# Patient Record
Sex: Male | Born: 1980 | Race: Black or African American | Hispanic: No | Marital: Single | Smoking: Never smoker
Health system: Southern US, Community
[De-identification: ages and names within clinical notes are randomized; demographics above are authoritative.]

## PROBLEM LIST (undated history)

## (undated) DIAGNOSIS — Z789 Other specified health status: Secondary | ICD-10-CM

## (undated) DIAGNOSIS — F419 Anxiety disorder, unspecified: Secondary | ICD-10-CM

## (undated) DIAGNOSIS — F32A Depression, unspecified: Secondary | ICD-10-CM

## (undated) HISTORY — PX: NO PAST SURGERIES: SHX2092

---
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
## 2004-07-14 ENCOUNTER — Emergency Department (HOSPITAL_COMMUNITY): Admission: EM | Admit: 2004-07-14 | Discharge: 2004-07-14 | Payer: Self-pay | Admitting: *Deleted

## 2006-02-07 IMAGING — CR DG FINGER THUMB 2+V*R*
1 series · 1 of 1 positions shown · non-contrast
Comparison: none

CLINICAL DATA: Right thumb injury, pain and swelling.  
 RIGHT THUMB - 3 VIEW:
 Soft tissue swelling overlying the MCP joint noted without evidence of fracture, subluxation, dislocation.

[view not recorded]
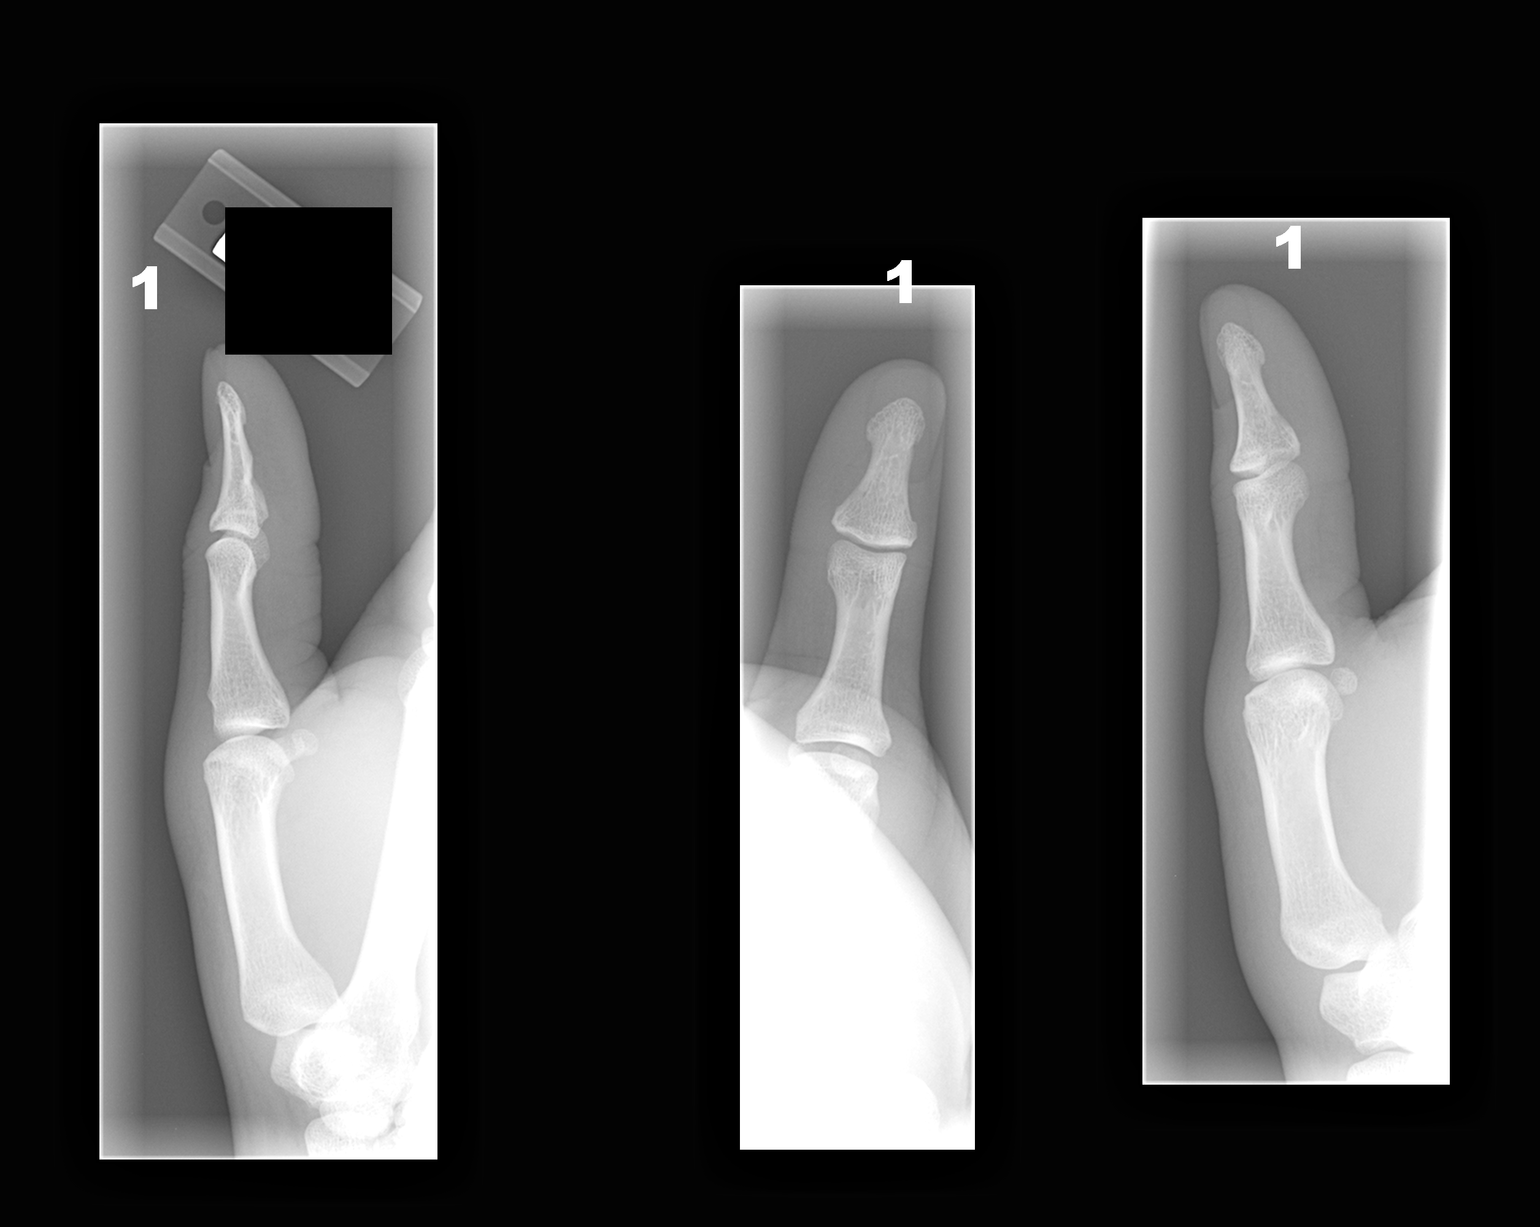

[1 of 1 positions shown; findings below may reference images not displayed]

IMPRESSION: Soft tissue swelling without acute bony abnormality.

## 2010-09-04 ENCOUNTER — Emergency Department (HOSPITAL_COMMUNITY)
Admission: EM | Admit: 2010-09-04 | Discharge: 2010-09-04 | Disposition: A | Discharge status: Home / Self Care | Payer: Self-pay | Attending: Emergency Medicine | Admitting: Emergency Medicine

## 2010-09-04 ENCOUNTER — Emergency Department (HOSPITAL_COMMUNITY): Payer: Self-pay

## 2010-09-04 DIAGNOSIS — S93409A Sprain of unspecified ligament of unspecified ankle, initial encounter: Secondary | ICD-10-CM | POA: Insufficient documentation

## 2010-09-04 DIAGNOSIS — W1789XA Other fall from one level to another, initial encounter: Secondary | ICD-10-CM | POA: Insufficient documentation

## 2010-10-18 ENCOUNTER — Emergency Department (HOSPITAL_COMMUNITY)
Admission: EM | Admit: 2010-10-18 | Discharge: 2010-10-18 | Discharge status: Against Medical Advice | Payer: Self-pay | Attending: Emergency Medicine | Admitting: Emergency Medicine

## 2010-10-18 DIAGNOSIS — Z0389 Encounter for observation for other suspected diseases and conditions ruled out: Secondary | ICD-10-CM | POA: Insufficient documentation

## 2018-10-25 ENCOUNTER — Encounter (HOSPITAL_COMMUNITY): Payer: Self-pay | Admitting: Emergency Medicine

## 2018-10-25 ENCOUNTER — Emergency Department (HOSPITAL_COMMUNITY)
Admission: EM | Admit: 2018-10-25 | Discharge: 2018-10-25 | Disposition: A | Discharge status: Home / Self Care | Payer: Self-pay | Attending: Emergency Medicine | Admitting: Emergency Medicine

## 2018-10-25 ENCOUNTER — Emergency Department (HOSPITAL_COMMUNITY): Payer: Self-pay

## 2018-10-25 ENCOUNTER — Other Ambulatory Visit: Payer: Self-pay

## 2018-10-25 DIAGNOSIS — Y929 Unspecified place or not applicable: Secondary | ICD-10-CM | POA: Insufficient documentation

## 2018-10-25 DIAGNOSIS — S82402A Unspecified fracture of shaft of left fibula, initial encounter for closed fracture: Secondary | ICD-10-CM | POA: Insufficient documentation

## 2018-10-25 DIAGNOSIS — Y9301 Activity, walking, marching and hiking: Secondary | ICD-10-CM | POA: Insufficient documentation

## 2018-10-25 DIAGNOSIS — W109XXA Fall (on) (from) unspecified stairs and steps, initial encounter: Secondary | ICD-10-CM | POA: Insufficient documentation

## 2018-10-25 DIAGNOSIS — Y999 Unspecified external cause status: Secondary | ICD-10-CM | POA: Insufficient documentation

## 2018-10-25 HISTORY — DX: Other specified health status: Z78.9

## 2018-10-25 MED ORDER — OXYCODONE-ACETAMINOPHEN 5-325 MG PO TABS
2.0000 | ORAL_TABLET | Freq: Once | ORAL | Status: AC
  Administered 2018-10-25: 2 via ORAL
  Filled 2018-10-25: qty 2

## 2018-10-25 MED ORDER — OXYCODONE-ACETAMINOPHEN 5-325 MG PO TABS: 1.0000 | ORAL_TABLET | ORAL | 0 refills | Status: DC | PRN

## 2018-10-25 MED ORDER — ONDANSETRON 4 MG PO TBDP
4.0000 mg | ORAL_TABLET | Freq: Once | ORAL | Status: AC
  Administered 2018-10-25: 04:00:00 4 mg via ORAL
  Filled 2018-10-25: qty 1

## 2018-10-25 NOTE — Progress Notes (Signed)
Orthopedic Tech Progress Note Patient Details:  SOTA HETZ 1980/10/03 [id number]  Ortho Devices Type of Ortho Device: Post (short leg) splint, Stirrup splint, Crutches Ortho Device/Splint Location: rle Ortho Device/Splint Interventions: Ordered, Application, Adjustment   Post Interventions Patient Tolerated: Well Instructions Provided: Care of device, Adjustment of device   Karolee Stamps 10/25/2018, 5:33 AM

## 2018-10-25 NOTE — ED Triage Notes (Signed)
BIB [facility] from home. Pt had mechanical fall on stairs in new home. Per EMS some ankle deformity. Sensation intact, able to move toes, and pulses palpable. Some ETOH onboard.

## 2018-10-25 NOTE — ED Notes (Signed)
Patient verbalizes understanding of discharge instructions. Opportunity for questioning and answers were provided. Armband removed by staff, pt discharged from ED ambulatory on crutches.

## 2018-10-25 NOTE — ED Provider Notes (Signed)
TIME SEEN: 2:49 AM  CHIEF COMPLAINT: Left ankle injury  HPI: Patient is a 38 year old male with no significant past medical history who presents to the emergency department with a left ankle injury.  States he slipped going down stairs and his ankle went behind him and underneath him and he felt a "pop".  No head injury or loss of consciousness.  No neck or back pain.  No chest or abdominal pain.  Unable to ambulate after the fall.  ROS: See HPI Constitutional: no fever  Eyes: no drainage  ENT: no runny nose   Cardiovascular:  no chest pain  Resp: no SOB  GI: no vomiting GU: no dysuria Integumentary: no rash  Allergy: no hives  Musculoskeletal: no leg swelling  Neurological: no slurred speech ROS otherwise negative  PAST MEDICAL HISTORY/PAST SURGICAL HISTORY:  Past Medical History:  Diagnosis Date  . No pertinent past medical history     MEDICATIONS:  Prior to Admission medications   Not on File    ALLERGIES:  No Known Allergies  SOCIAL HISTORY:  Social History   Tobacco Use  . Smoking status: Never Smoker  . Smokeless tobacco: Never Used  Substance Use Topics  . Alcohol use: Yes    Alcohol/week: 2.0 standard drinks    Types: 2 Cans of beer per week    Comment: Daily    FAMILY HISTORY: No family history on file.  EXAM: BP (!) 141/97 (BP Location: Right Arm)   Pulse (!) 101   Temp 99 F (37.2 C) (Oral)   Resp (!) 24   Ht 5\' 8"  (1.727 m)   Wt 106.6 kg   SpO2 100%   BMI 35.73 kg/m  CONSTITUTIONAL: Alert and oriented and responds appropriately to questions. Well-appearing; well-nourished; GCS 15, obese, in no distress, laughing HEAD: Normocephalic; atraumatic EYES: Conjunctivae clear, PERRL, EOMI ENT: normal nose; no rhinorrhea; moist mucous membranes; pharynx without lesions noted; no dental injury; no septal hematoma NECK: Supple, no meningismus, no LAD; no midline spinal tenderness, step-off or deformity; trachea midline CARD: Regular and minimally  tachycardic; S1 and S2 appreciated; no murmurs, no clicks, no rubs, no gallops RESP: Normal chest excursion without splinting or tachypnea; breath sounds clear and equal bilaterally; no wheezes, no rhonchi, no rales; no hypoxia or respiratory distress CHEST:  chest wall stable, no crepitus or ecchymosis or deformity, nontender to palpation; no flail chest ABD/GI: Normal bowel sounds; non-distended; soft, non-tender, no rebound, no guarding; no ecchymosis or other lesions noted PELVIS:  stable, nontender to palpation BACK:  The back appears normal and is non-tender to palpation, there is no CVA tenderness; no midline spinal tenderness, step-off or deformity EXT: Patient has swelling, bruising and tenderness over the left medial malleolus.  He has a small punctate wound here that he states was from a previous insect bite.  No tenderness over the left foot, proximal tibia or fibula including the left fibular head, knee, femur or hip.  His compartments in the left leg are soft.  He is a 2+ left DP pulse and can move his toes without difficulty.  Reports normal sensation throughout the left leg.  Otherwise his extremities have no deformity noted. SKIN: Normal color for age and race; warm NEURO: Moves all extremities equally PSYCH: The patient's mood and manner are appropriate. Grooming and personal hygiene are appropriate.  MEDICAL DECISION MAKING: Patient here with left ankle injury.  Will obtain x-rays.  No other injury on exam.  He has neurovascularly intact distally.  Have  offered him pain medication several times which he declines.  ED PROGRESS: X-ray showed displaced distal fibular fracture and widening of the medial clear space consistent with ligamentous injury.  Will place in a posterior splint with stirrups.  We will keep patient nonweightbearing and have him follow-up with orthopedics as an outpatient.  Will discharge with pain medication.   At this time, I do not feel there is any  life-threatening condition present. I have reviewed and discussed all results (EKG, imaging, lab, urine as appropriate) and exam findings with patient/family. I have reviewed nursing notes and appropriate previous records.  I feel the patient is safe to be discharged home without further emergent workup and can continue workup as an outpatient as needed. Discussed usual and customary return precautions. Patient/family verbalize understanding and are comfortable with this plan.  Outpatient follow-up has been provided as needed. All questions have been answered.    SPLINT APPLICATION Date/Time: 3:32 AM Authorized by: Baxter HireKristen Hermes Wafer Consent: Verbal consent obtained. Risks and benefits: risks, benefits and alternatives were discussed Consent given by: patient Splint applied by: orthopedic technician Location details: left ankle Splint type: posterior splint with stirrups Supplies used: fiberglass Post-procedure: The splinted body part was neurovascularly unchanged following the procedure. Patient tolerance: Patient tolerated the procedure well with no immediate complications.        Edilson Vital, Layla MawKristen N, DO 10/25/18 [phone]

## 2018-10-27 ENCOUNTER — Encounter (HOSPITAL_BASED_OUTPATIENT_CLINIC_OR_DEPARTMENT_OTHER): Payer: Self-pay | Admitting: *Deleted

## 2018-10-27 ENCOUNTER — Other Ambulatory Visit: Payer: Self-pay

## 2018-10-27 ENCOUNTER — Other Ambulatory Visit (HOSPITAL_COMMUNITY)
Admission: RE | Admit: 2018-10-27 | Discharge: 2018-10-27 | Disposition: A | Discharge status: Home / Self Care | Payer: HRSA Program | Source: Ambulatory Visit | Attending: Orthopedic Surgery | Admitting: Orthopedic Surgery

## 2018-10-27 DIAGNOSIS — Z1159 Encounter for screening for other viral diseases: Secondary | ICD-10-CM | POA: Diagnosis present

## 2018-10-27 LAB — SARS CORONAVIRUS 2 (TAT 6-24 HRS): SARS Coronavirus 2: POSITIVE — AB

## 2018-10-27 NOTE — Anesthesia Preprocedure Evaluation (Addendum)
Anesthesia Evaluation  Patient identified by MRN, date of birth, ID band Patient awake    Reviewed: Allergy & Precautions, H&P , NPO status , Patient's Chart, lab work & pertinent test results  Airway Mallampati: II  TM Distance: >3 FB Neck ROM: Full    Dental no notable dental hx. (+) Teeth Intact, Dental Advisory Given   Pulmonary neg pulmonary ROS,    Pulmonary exam normal breath sounds clear to auscultation       Cardiovascular negative cardio ROS   Rhythm:Regular Rate:Normal     Neuro/Psych negative neurological ROS  negative psych ROS   GI/Hepatic negative GI ROS, Neg liver ROS,   Endo/Other  Morbid obesityObesity  Renal/GU negative Renal ROS  negative genitourinary   Musculoskeletal Fx/dislocation right fibular Fx   Abdominal   Peds  Hematology negative hematology ROS (+)   Anesthesia Other Findings   Reproductive/Obstetrics negative OB ROS                            Anesthesia Physical Anesthesia Plan  ASA: II  Anesthesia Plan: MAC and Regional   Post-op Pain Management:    Induction: Intravenous  PONV Risk Score and Plan: 2 and Ondansetron, Treatment may vary due to age or medical condition, Propofol infusion and Dexamethasone  Airway Management Planned: Simple Face Mask  Additional Equipment:   Intra-op Plan:   Post-operative Plan:   Informed Consent: I have reviewed the patients History and Physical, chart, labs and discussed the procedure including the risks, benefits and alternatives for the proposed anesthesia with the patient or authorized representative who has indicated his/her understanding and acceptance.     Dental advisory given  Plan Discussed with: CRNA  Anesthesia Plan Comments: (Covid-19 test is positive.)      Anesthesia Quick Evaluation

## 2018-10-28 ENCOUNTER — Ambulatory Visit (HOSPITAL_COMMUNITY): Payer: Self-pay | Admitting: Anesthesiology

## 2018-10-28 ENCOUNTER — Encounter (HOSPITAL_COMMUNITY)
Admission: RE | Disposition: A | Discharge status: Home / Self Care | Payer: Self-pay | Source: Ambulatory Visit | Attending: Orthopedic Surgery

## 2018-10-28 ENCOUNTER — Encounter (HOSPITAL_COMMUNITY): Payer: Self-pay | Admitting: *Deleted

## 2018-10-28 ENCOUNTER — Ambulatory Visit (HOSPITAL_COMMUNITY)
Admission: RE | Admit: 2018-10-28 | Discharge: 2018-10-28 | Disposition: A | Discharge status: Home / Self Care | Payer: Self-pay | Source: Ambulatory Visit | Attending: Orthopedic Surgery | Admitting: Orthopedic Surgery

## 2018-10-28 ENCOUNTER — Other Ambulatory Visit: Payer: Self-pay

## 2018-10-28 DIAGNOSIS — W19XXXA Unspecified fall, initial encounter: Secondary | ICD-10-CM | POA: Insufficient documentation

## 2018-10-28 DIAGNOSIS — S93431A Sprain of tibiofibular ligament of right ankle, initial encounter: Secondary | ICD-10-CM | POA: Insufficient documentation

## 2018-10-28 DIAGNOSIS — S82831A Other fracture of upper and lower end of right fibula, initial encounter for closed fracture: Secondary | ICD-10-CM | POA: Insufficient documentation

## 2018-10-28 DIAGNOSIS — S82891A Other fracture of right lower leg, initial encounter for closed fracture: Secondary | ICD-10-CM

## 2018-10-28 HISTORY — PX: ORIF ANKLE FRACTURE: SHX5408

## 2018-10-28 SURGERY — OPEN REDUCTION INTERNAL FIXATION (ORIF) ANKLE FRACTURE
Anesthesia: Monitor Anesthesia Care | Laterality: Right

## 2018-10-28 MED ORDER — ONDANSETRON HCL 4 MG/2ML IJ SOLN
INTRAMUSCULAR | Status: AC
  Filled 2018-10-28: qty 2

## 2018-10-28 MED ORDER — METHOCARBAMOL 500 MG PO TABS: 500.0000 mg | ORAL_TABLET | Freq: Two times a day (BID) | ORAL | 0 refills | Status: AC | PRN

## 2018-10-28 MED ORDER — DEXAMETHASONE SODIUM PHOSPHATE 10 MG/ML IJ SOLN
INTRAMUSCULAR | Status: DC | PRN
  Administered 2018-10-28: 4 mg via INTRAVENOUS

## 2018-10-28 MED ORDER — CELECOXIB 200 MG PO CAPS
ORAL_CAPSULE | ORAL | Status: AC
  Administered 2018-10-28: 09:00:00 400 mg via ORAL
  Filled 2018-10-28: qty 1

## 2018-10-28 MED ORDER — LACTATED RINGERS IV SOLN
INTRAVENOUS | Status: DC
  Administered 2018-10-28: 10:00:00 via INTRAVENOUS

## 2018-10-28 MED ORDER — BUPIVACAINE HCL (PF) 0.25 % IJ SOLN
INTRAMUSCULAR | Status: AC
  Filled 2018-10-28: qty 30

## 2018-10-28 MED ORDER — BUPIVACAINE HCL (PF) 0.5 % IJ SOLN
INTRAMUSCULAR | Status: DC | PRN
  Administered 2018-10-28: 10 mL

## 2018-10-28 MED ORDER — CELECOXIB 100 MG PO CAPS
400.0000 mg | ORAL_CAPSULE | Freq: Once | ORAL | Status: AC
  Administered 2018-10-28: 09:00:00 400 mg via ORAL
  Filled 2018-10-28: qty 4

## 2018-10-28 MED ORDER — MIDAZOLAM HCL 5 MG/5ML IJ SOLN
INTRAMUSCULAR | Status: DC | PRN
  Administered 2018-10-28: 2 mg via INTRAVENOUS

## 2018-10-28 MED ORDER — PROPOFOL 10 MG/ML IV BOLUS
INTRAVENOUS | Status: AC
  Filled 2018-10-28: qty 20

## 2018-10-28 MED ORDER — ACETAMINOPHEN 500 MG PO TABS
1000.0000 mg | ORAL_TABLET | Freq: Once | ORAL | Status: AC
  Administered 2018-10-28: 09:00:00 1000 mg via ORAL

## 2018-10-28 MED ORDER — BUPIVACAINE-EPINEPHRINE (PF) 0.5% -1:200000 IJ SOLN
INTRAMUSCULAR | Status: DC | PRN
  Administered 2018-10-28: 30 mL via PERINEURAL

## 2018-10-28 MED ORDER — FENTANYL CITRATE (PF) 100 MCG/2ML IJ SOLN
INTRAMUSCULAR | Status: DC | PRN
  Administered 2018-10-28 (×2): 50 ug via INTRAVENOUS

## 2018-10-28 MED ORDER — GABAPENTIN 300 MG PO CAPS
ORAL_CAPSULE | ORAL | Status: AC
  Administered 2018-10-28: 09:00:00 300 mg via ORAL
  Filled 2018-10-28: qty 1

## 2018-10-28 MED ORDER — GABAPENTIN 300 MG PO CAPS: 300.0000 mg | ORAL_CAPSULE | Freq: Two times a day (BID) | ORAL | 0 refills | Status: AC | PRN

## 2018-10-28 MED ORDER — CHLORHEXIDINE GLUCONATE 4 % EX LIQD: 60.0000 mL | Freq: Once | CUTANEOUS | Status: DC

## 2018-10-28 MED ORDER — CELECOXIB 200 MG PO CAPS: 200.0000 mg | ORAL_CAPSULE | Freq: Two times a day (BID) | ORAL | 0 refills | Status: AC

## 2018-10-28 MED ORDER — CELECOXIB 200 MG PO CAPS
ORAL_CAPSULE | ORAL | Status: AC
  Filled 2018-10-28: qty 1

## 2018-10-28 MED ORDER — CEFAZOLIN SODIUM-DEXTROSE 2-4 GM/100ML-% IV SOLN
INTRAVENOUS | Status: AC
  Filled 2018-10-28: qty 100

## 2018-10-28 MED ORDER — PROPOFOL 500 MG/50ML IV EMUL
INTRAVENOUS | Status: DC | PRN
  Administered 2018-10-28: 30 ug/kg/min via INTRAVENOUS

## 2018-10-28 MED ORDER — 0.9 % SODIUM CHLORIDE (POUR BTL) OPTIME
TOPICAL | Status: DC | PRN
  Administered 2018-10-28: 1000 mL

## 2018-10-28 MED ORDER — PHENYLEPHRINE 40 MCG/ML (10ML) SYRINGE FOR IV PUSH (FOR BLOOD PRESSURE SUPPORT)
PREFILLED_SYRINGE | INTRAVENOUS | Status: AC
  Filled 2018-10-28: qty 10

## 2018-10-28 MED ORDER — MIDAZOLAM HCL 2 MG/2ML IJ SOLN
INTRAMUSCULAR | Status: AC
  Filled 2018-10-28: qty 2

## 2018-10-28 MED ORDER — FENTANYL CITRATE (PF) 250 MCG/5ML IJ SOLN
INTRAMUSCULAR | Status: AC
  Filled 2018-10-28: qty 5

## 2018-10-28 MED ORDER — GABAPENTIN 100 MG PO CAPS
300.0000 mg | ORAL_CAPSULE | Freq: Once | ORAL | Status: AC
  Administered 2018-10-28: 09:00:00 300 mg via ORAL

## 2018-10-28 MED ORDER — ACETAMINOPHEN 500 MG PO TABS
ORAL_TABLET | ORAL | Status: AC
  Administered 2018-10-28: 1000 mg via ORAL
  Filled 2018-10-28: qty 2

## 2018-10-28 MED ORDER — ONDANSETRON HCL 4 MG PO TABS: 4.0000 mg | ORAL_TABLET | Freq: Three times a day (TID) | ORAL | 0 refills | Status: AC | PRN

## 2018-10-28 MED ORDER — ONDANSETRON HCL 4 MG/2ML IJ SOLN
INTRAMUSCULAR | Status: DC | PRN
  Administered 2018-10-28: 4 mg via INTRAVENOUS

## 2018-10-28 MED ORDER — OXYCODONE HCL 5 MG PO TABS: 5.0000 mg | ORAL_TABLET | ORAL | 0 refills | Status: AC | PRN

## 2018-10-28 MED ORDER — CEFAZOLIN SODIUM-DEXTROSE 2-4 GM/100ML-% IV SOLN
2.0000 g | INTRAVENOUS | Status: AC
  Administered 2018-10-28: 2 g via INTRAVENOUS

## 2018-10-28 MED ORDER — ACETAMINOPHEN 500 MG PO TABS: 1000.0000 mg | ORAL_TABLET | Freq: Three times a day (TID) | ORAL | 0 refills | Status: AC

## 2018-10-28 MED ORDER — ASPIRIN EC 81 MG PO TBEC: 81.0000 mg | DELAYED_RELEASE_TABLET | Freq: Two times a day (BID) | ORAL | 0 refills | Status: AC

## 2018-10-28 SURGICAL SUPPLY — 68 items
BANDAGE ACE 4X5 VEL STRL LF (GAUZE/BANDAGES/DRESSINGS) ×2 IMPLANT
BANDAGE ELASTIC 4 VELCRO ST LF (GAUZE/BANDAGES/DRESSINGS) ×2 IMPLANT
BANDAGE ESMARK 6X9 LF (GAUZE/BANDAGES/DRESSINGS) ×1 IMPLANT
BIT DRILL 2.5 CANN STRL (BIT) ×2 IMPLANT
BIT DRILL 3.5 CANN STRL (BIT) ×2 IMPLANT
BNDG ESMARK 6X9 LF (GAUZE/BANDAGES/DRESSINGS) ×3
CANISTER SUCT 3000ML PPV (MISCELLANEOUS) ×3 IMPLANT
CLOSURE WOUND 1/2 X4 (GAUZE/BANDAGES/DRESSINGS)
COVER SURGICAL LIGHT HANDLE (MISCELLANEOUS) ×3 IMPLANT
COVER WAND RF STERILE (DRAPES) ×1 IMPLANT
CUFF TOURNIQUET SINGLE 34IN LL (TOURNIQUET CUFF) IMPLANT
DRAPE C-ARM 42X72 X-RAY (DRAPES) IMPLANT
DRAPE C-ARMOR (DRAPES) IMPLANT
DRAPE OEC MINIVIEW 54X84 (DRAPES) ×3 IMPLANT
DRAPE ORTHO SPLIT 77X108 STRL (DRAPES)
DRAPE SURG ORHT 6 SPLT 77X108 (DRAPES) IMPLANT
DRAPE U-SHAPE 47X51 STRL (DRAPES) ×2 IMPLANT
DRSG ADAPTIC 3X8 NADH LF (GAUZE/BANDAGES/DRESSINGS) ×2 IMPLANT
DRSG PAD ABDOMINAL 8X10 ST (GAUZE/BANDAGES/DRESSINGS) ×2 IMPLANT
DURAPREP 26ML APPLICATOR (WOUND CARE) ×3 IMPLANT
ELECT REM PT RETURN 9FT ADLT (ELECTROSURGICAL) ×3
ELECTRODE REM PT RTRN 9FT ADLT (ELECTROSURGICAL) ×1 IMPLANT
GAUZE SPONGE 4X4 12PLY STRL (GAUZE/BANDAGES/DRESSINGS) ×1 IMPLANT
GAUZE SPONGE 4X4 12PLY STRL LF (GAUZE/BANDAGES/DRESSINGS) ×2 IMPLANT
GLOVE BIO SURGEON STRL SZ7.5 (GLOVE) ×6 IMPLANT
GLOVE BIO SURGEON STRL SZ8 (GLOVE) ×3 IMPLANT
GLOVE BIOGEL PI IND STRL 8 (GLOVE) ×2 IMPLANT
GLOVE BIOGEL PI INDICATOR 8 (GLOVE) ×4
GOWN STRL REUS W/ TWL LRG LVL3 (GOWN DISPOSABLE) ×2 IMPLANT
GOWN STRL REUS W/ TWL XL LVL3 (GOWN DISPOSABLE) ×1 IMPLANT
GOWN STRL REUS W/TWL LRG LVL3 (GOWN DISPOSABLE) ×6
GOWN STRL REUS W/TWL XL LVL3 (GOWN DISPOSABLE)
IMPL TIGHTROP W/DRV K-LESS (Anchor) IMPLANT
IMPLANT TIGHTROPE W/DRV K-LESS (Anchor) ×6 IMPLANT
KIT BASIN OR (CUSTOM PROCEDURE TRAY) ×3 IMPLANT
KIT TURNOVER KIT B (KITS) ×3 IMPLANT
MANIFOLD NEPTUNE II (INSTRUMENTS) ×3 IMPLANT
NEEDLE 22X1 1/2 (OR ONLY) (NEEDLE) ×1 IMPLANT
NS IRRIG 1000ML POUR BTL (IV SOLUTION) ×3 IMPLANT
PACK ORTHO EXTREMITY (CUSTOM PROCEDURE TRAY) ×3 IMPLANT
PAD ARMBOARD 7.5X6 YLW CONV (MISCELLANEOUS) ×6 IMPLANT
PAD CAST 4YDX4 CTTN HI CHSV (CAST SUPPLIES) ×2 IMPLANT
PADDING CAST COTTON 4X4 STRL (CAST SUPPLIES) ×2
PLATE THIRD TUBULAR 8 HOLE (Plate) ×2 IMPLANT
SCREW CANC T15 FT 12X4XST LCK (Screw) IMPLANT
SCREW CANCELLOUS 4.0X12MM (Screw) ×3 IMPLANT
SCREW LOW PROFILE 3.5X14 (Screw) ×4 IMPLANT
SCREW NON-LOCKING 3.5X12MM (Screw) ×6 IMPLANT
SPLINT PLASTER CAST XFAST 5X30 (CAST SUPPLIES) IMPLANT
SPLINT PLASTER XFAST SET 5X30 (CAST SUPPLIES) ×8
SPONGE LAP 18X18 RF (DISPOSABLE) ×1 IMPLANT
STRIP CLOSURE SKIN 1/2X4 (GAUZE/BANDAGES/DRESSINGS) ×1 IMPLANT
SUCTION FRAZIER HANDLE 10FR (MISCELLANEOUS) ×2
SUCTION TUBE FRAZIER 10FR DISP (MISCELLANEOUS) ×1 IMPLANT
SUT MNCRL AB 4-0 PS2 18 (SUTURE) ×3 IMPLANT
SUT MON AB 2-0 CT1 27 (SUTURE) ×2 IMPLANT
SUT VIC AB 0 CT1 27 (SUTURE)
SUT VIC AB 0 CT1 27XBRD ANBCTR (SUTURE) IMPLANT
SYR BULB IRRIGATION 50ML (SYRINGE) ×3 IMPLANT
SYR CONTROL 10ML LL (SYRINGE) IMPLANT
TOWEL GREEN STERILE FF (TOWEL DISPOSABLE) ×3 IMPLANT
TOWEL OR 17X26 10 PK STRL BLUE (TOWEL DISPOSABLE) ×3 IMPLANT
TUBE CONNECTING 12'X1/4 (SUCTIONS)
TUBE CONNECTING 12X1/4 (SUCTIONS) ×1 IMPLANT
UNDERPAD 30X30 (UNDERPADS AND DIAPERS) ×4 IMPLANT
WATER STERILE IRR 1000ML POUR (IV SOLUTION) ×3 IMPLANT
YANKAUER SUCT BULB TIP NO VENT (SUCTIONS) IMPLANT
arthrex ×3 IMPLANT

## 2018-10-28 NOTE — Anesthesia Procedure Notes (Signed)
Procedure Name: MAC Date/Time: 10/28/2018 10:29 AM Performed by: Teressa Lower., CRNA Pre-anesthesia Checklist: Patient identified, Emergency Drugs available, Suction available and Patient being monitored Patient Re-evaluated:Patient Re-evaluated prior to induction Oxygen Delivery Method: Simple face mask

## 2018-10-28 NOTE — Op Note (Signed)
10/28/2018  5:31 PM  PATIENT:  Ryan Huynh.    PRE-OPERATIVE DIAGNOSIS:  RIGHT ANKLE FRACTURE  POST-OPERATIVE DIAGNOSIS:  Same  PROCEDURE:  OPEN REDUCTION INTERNAL FIXATION (ORIF) ANKLE FRACTURE RIGHT  SURGEON:  Renette Butters, MD  ASSISTANT: Roxan Hockey, PA-C, he was present and scrubbed throughout the case, critical for completion in a timely fashion, and for retraction, instrumentation, and closure.   ANESTHESIA:   gen   PREOPERATIVE INDICATIONS:  Ryan Huynh. is a  38 y.o. male with a diagnosis of RIGHT ANKLE FRACTURE who failed conservative measures and elected for surgical management.    The risks benefits and alternatives were discussed with the patient preoperatively including but not limited to the risks of infection, bleeding, nerve injury, cardiopulmonary complications, the need for revision surgery, among others, and the patient was willing to proceed.  OPERATIVE IMPLANTS: arthrex plate and dual tight rope  OPERATIVE FINDINGS: Unstable ankle fracture. Stable syndesmosis post op  BLOOD LOSS: min  COMPLICATIONS: none  TOURNIQUET TIME: 57min  OPERATIVE PROCEDURE:  Patient was identified in the preoperative holding area and site was marked by me He was transported to the operating theater and placed on the table in supine position taking care to pad all bony prominences. After a preincinduction time out anesthesia was induced. The right lower extremity was prepped and draped in normal sterile fashion and a pre-incision timeout was performed. Ryan Huynh. received ancef for preoperative antibiotics.   I made a lateral incision of roughly 7 cm dissection was carried down sharply to the distal fibula and then spreading dissection was used proximally to protect the superficial peroneal nerve. I sharply incised the periosteum and took care to protect the peroneal tendons. I then debrided the fracture site and performed a reduction maneuver which was  held in place with a clamp.   I placed a lag screw across the fracture  I then selected a 8-hole one third tubular plate and placed in a neutralization fashion care was taken distally so as not to penetrate the joint with the cancellus screws.  I then stressed the syndesmosis and it was unstable for syndesmotic fixation I performed a reduction maneuver with a clamp and placed 2 tight ropes  This did effectively reduced his talus back into his ankle joint talar tilt was assessed and was well controlled this effectively reduced his deltoid ligament tear as well.  The wound was then thoroughly irrigated and closed using a 0 Vicryl and absorbable Monocryl sutures. He was placed in a short leg splint.   POST OPERATIVE PLAN: Non-weightbearing. DVT prophylaxis will consist of mobilization and chemical px

## 2018-10-28 NOTE — Discharge Instructions (Signed)
It is very important for you to Elevate your leg - Toes above nose as much as possible to reduce pain / swelling.  Weight Bearing:  Non weight bearing affected leg.  Diet: As you were doing prior to hospitalization   Shower:  You have a splint on, leave the splint in place and keep the splint dry with a plastic bag.  Dressing:  You have a splint. Leave the splint in place and we will change your bandages during your first follow-up appointment.  You may loosen and re-apply ace wrap if it feels too tight.    Activity:  Increase activity slowly as tolerated, but follow the weight bearing instructions below.  The rules on driving is that you can not be taking narcotics while you drive, and you must feel in control of the vehicle.    To prevent constipation:  Narcotic medicines cause constipation.  Wean these as soon as is appropriate.   You may use a stool softener such as -  Colace (over the counter) 100 mg by mouth twice a day  Drink plenty of fluids (prune juice may be helpful) and high fiber foods Miralax (over the counter) for constipation as needed.    Itching:  If you experience itching with your medications, try taking only a single pain pill, or even half a pain pill at a time.  You can also use benadryl over the counter for itching or also to help with sleep.   Precautions:  If you experience chest pain or shortness of breath - call 911 immediately for transfer to the hospital emergency department!!  If you develop a fever greater that 101 F, purulent drainage from wound, increased redness or drainage from wound, or calf pain -- Call the office at [phone]                                                 Follow- Up Appointment:  Please call for an appointment to be seen in 1-2 weeks [place] - [phone]    

## 2018-10-28 NOTE — H&P (Signed)
ORTHOPAEDIC CONSULTATION  REQUESTING PHYSICIAN: Renette Butters, MD  Chief Complaint: right ankle fracture dislocation  HPI: Ryan Huynh. is a 38 y.o. male who complains of  A mechanical fall  Past Medical History:  Diagnosis Date  . No pertinent past medical history    Past Surgical History:  Procedure Laterality Date  . NO PAST SURGERIES     Social History   Socioeconomic History  . Marital status: Single    Spouse name: Not on file  . Number of children: Not on file  . Years of education: Not on file  . Highest education level: Not on file  Occupational History  . Not on file  Social Needs  . Financial resource strain: Not on file  . Food insecurity    Worry: Not on file    Inability: Not on file  . Transportation needs    Medical: Not on file    Non-medical: Not on file  Tobacco Use  . Smoking status: Never Smoker  . Smokeless tobacco: Never Used  Substance and Sexual Activity  . Alcohol use: Yes    Alcohol/week: 2.0 standard drinks    Types: 2 Cans of beer per week    Comment: Daily  . Drug use: Not Currently  . Sexual activity: Yes  Lifestyle  . Physical activity    Days per week: Not on file    Minutes per session: Not on file  . Stress: Not on file  Relationships  . Social Herbalist on phone: Not on file    Gets together: Not on file    Attends religious service: Not on file    Active member of club or organization: Not on file    Attends meetings of clubs or organizations: Not on file    Relationship status: Not on file  Other Topics Concern  . Not on file  Social History Narrative  . Not on file   History reviewed. No pertinent family history. No Known Allergies Prior to Admission medications   Medication Sig Start Date End Date Taking? Authorizing Provider  oxyCODONE-acetaminophen (PERCOCET/ROXICET) 5-325 MG tablet Take 1 tablet by mouth every 4 (four) hours as needed. 10/25/18  Yes Ward, Kristen N, DO   No  results found.  Positive ROS: All other systems have been reviewed and were otherwise negative with the exception of those mentioned in the HPI and as above.  Labs cbc No results for input(s): WBC, HGB, HCT, PLT in the last 72 hours.  Labs inflam No results for input(s): CRP in the last 72 hours.  Invalid input(s): ESR  Labs coag No results for input(s): INR, PTT in the last 72 hours.  Invalid input(s): PT  No results for input(s): NA, K, CL, CO2, GLUCOSE, BUN, CREATININE, CALCIUM in the last 72 hours.  Physical Exam: Vitals:   10/28/18 0848  BP: (!) 158/97  Pulse: 99  Resp: 20  Temp: 98.9 F (37.2 C)  SpO2: 99%   General: Alert, no acute distress Cardiovascular: No pedal edema Respiratory: No cyanosis, no use of accessory musculature GI: No organomegaly, abdomen is soft and non-tender Skin: No lesions in the area of chief complaint other than those listed below in MSK exam.  Neurologic: Sensation intact distally save for the below mentioned MSK exam Psychiatric: Patient is competent for consent with normal mood and affect Lymphatic: No axillary or cervical lymphadenopathy  MUSCULOSKELETAL:  RLE: splint intact NVI distally Other extremities are atraumatic  with painless ROM and NVI.  Assessment: R ankle fracture dislocation, bimal equivalent.   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  He was set up for urgent ORIF of his ankle given impending skin compromise which would be severely detrimental his skin broke down and cause severe morbidity.  He was presented to the outpatient center but did not was not allowed entrance [facility] due to his COVID test being positive.  He was then using all precautions moved over to [facility]  He was kept an appropriate pressure room.  And we plan to do  an ORIF of his ankle given the urgent/emergent nature of his impending skin compromise and ankle dislocation.  We will follow-up follow all COVID protocols.   Renette Butters, MD Cell [phone]   10/28/2018 9:00 AM

## 2018-10-28 NOTE — Anesthesia Procedure Notes (Signed)
Anesthesia Regional Block: Popliteal block   Pre-Anesthetic Checklist: ,, timeout performed, Correct Patient, Correct Site, Correct Laterality, Correct Procedure, Correct Position, site marked, Risks and benefits discussed, pre-op evaluation,  At surgeon's request and post-op pain management  Laterality: Right  Prep: Maximum Sterile Barrier Precautions used, chloraprep       Needles:  Injection technique: Single-shot  Needle Type: Echogenic Stimulator Needle     Needle Length: 9cm  Needle Gauge: 21     Additional Needles:   Procedures:,,,, ultrasound used (permanent image in chart),,,,  Narrative:  Start time: 10/28/2018 10:25 AM End time: 10/28/2018 10:35 AM Injection made incrementally with aspirations every 5 mL. Anesthesiologist: Roderic Palau, MD  Additional Notes: 2% Lidocaine skin wheel. Saphenous block with 10cc of 0.5% Bupivicaine plain.

## 2018-10-28 NOTE — OR Nursing (Signed)
Dr. Alain Marion declared procedure an emergency.  OR case moved from [facility] to [facility].  Patient contacted to come to [facility] for surgical procedure.  Dr. Lanelle Bal reviewed procedure and agreed to move forward as an emergency.

## 2018-10-28 NOTE — Transfer of Care (Signed)
Immediate Anesthesia Transfer of Care Note  Patient: Ryan Huynh.  Procedure(s) Performed: OPEN REDUCTION INTERNAL FIXATION (ORIF) ANKLE FRACTURE (Right )  Patient Location: OR covid+ pt, recovered and discharged  Anesthesia Type:MAC combined with regional for post-op pain  Level of Consciousness: awake, alert  and oriented  Airway & Oxygen Therapy: Patient Spontanous Breathing  Post-op Assessment: Report given to RN and Post -op Vital signs reviewed and stable  Post vital signs: Reviewed and stable  Last Vitals:  Vitals Value Taken Time  BP    Temp    Pulse    Resp    SpO2      Last Pain:  Vitals:   10/28/18 0848  PainSc: 2       Patients Stated Pain Goal: 2 (97/53/00 5110)  Complications: No apparent anesthesia complications

## 2018-10-28 NOTE — Anesthesia Postprocedure Evaluation (Signed)
Anesthesia Post Note  Patient: Ryan Huynh.  Procedure(s) Performed: OPEN REDUCTION INTERNAL FIXATION (ORIF) ANKLE FRACTURE RIGHT (Right )     Patient location during evaluation: PACU Anesthesia Type: Regional and MAC Level of consciousness: awake and alert Pain management: pain level controlled Vital Signs Assessment: post-procedure vital signs reviewed and stable Respiratory status: spontaneous breathing, nonlabored ventilation and respiratory function stable Cardiovascular status: stable and blood pressure returned to baseline Postop Assessment: no apparent nausea or vomiting Anesthetic complications: no    Last Vitals:  Vitals:   10/28/18 0848  BP: (!) 158/97  Pulse: 99  Resp: 20  Temp: 37.2 C  SpO2: 99%    Last Pain:  Vitals:   10/28/18 0848  PainSc: 2                  Yeray Tomas,W. EDMOND

## 2018-10-29 ENCOUNTER — Encounter (HOSPITAL_COMMUNITY): Payer: Self-pay | Admitting: Orthopedic Surgery

## 2020-10-17 ENCOUNTER — Emergency Department (HOSPITAL_COMMUNITY)
Admission: EM | Admit: 2020-10-17 | Discharge: 2020-10-17 | Disposition: A | Discharge status: Home / Self Care | Payer: Self-pay | Attending: Emergency Medicine | Admitting: Emergency Medicine

## 2020-10-17 DIAGNOSIS — Y905 Blood alcohol level of 100-119 mg/100 ml: Secondary | ICD-10-CM | POA: Diagnosis not present

## 2020-10-17 DIAGNOSIS — Z7982 Long term (current) use of aspirin: Secondary | ICD-10-CM | POA: Diagnosis not present

## 2020-10-17 DIAGNOSIS — E872 Acidosis: Secondary | ICD-10-CM | POA: Insufficient documentation

## 2020-10-17 DIAGNOSIS — E8729 Other acidosis: Secondary | ICD-10-CM

## 2020-10-17 DIAGNOSIS — E876 Hypokalemia: Secondary | ICD-10-CM | POA: Diagnosis not present

## 2020-10-17 DIAGNOSIS — R252 Cramp and spasm: Secondary | ICD-10-CM | POA: Diagnosis present

## 2020-10-17 LAB — COMPREHENSIVE METABOLIC PANEL
ALT: 44 U/L (ref 0–44)
AST: 43 U/L — ABNORMAL HIGH (ref 15–41)
Albumin: 4.9 g/dL (ref 3.5–5.0)
Alkaline Phosphatase: 51 U/L (ref 38–126)
Anion gap: 17 — ABNORMAL HIGH (ref 5–15)
BUN: 9 mg/dL (ref 6–20)
CO2: 21 mmol/L — ABNORMAL LOW (ref 22–32)
Calcium: 9 mg/dL (ref 8.9–10.3)
Chloride: 100 mmol/L (ref 98–111)
Creatinine, Ser: 0.67 mg/dL (ref 0.61–1.24)
GFR, Estimated: >60 mL/min (ref >60)
Glucose, Bld: 127 mg/dL — ABNORMAL HIGH (ref 70–99)
Potassium: 3 mmol/L — ABNORMAL LOW (ref 3.5–5.1)
Sodium: 138 mmol/L (ref 135–145)
Total Bilirubin: 1.1 mg/dL (ref 0.3–1.2)
Total Protein: 9.1 g/dL — ABNORMAL HIGH (ref 6.5–8.1)

## 2020-10-17 LAB — URINALYSIS, ROUTINE W REFLEX MICROSCOPIC
Bilirubin Urine: NEGATIVE
Glucose, UA: NEGATIVE mg/dL
Hgb urine dipstick: NEGATIVE
Ketones, ur: 20 mg/dL — AB
Leukocytes,Ua: NEGATIVE
Nitrite: NEGATIVE
Protein, ur: NEGATIVE mg/dL
Specific Gravity, Urine: 1.01 (ref 1.005–1.030)
pH: 9 — ABNORMAL HIGH (ref 5.0–8.0)

## 2020-10-17 LAB — CBC WITH DIFFERENTIAL/PLATELET
Abs Immature Granulocytes: 0.02 10*3/uL (ref 0.00–0.07)
Basophils Absolute: 0 10*3/uL (ref 0.0–0.1)
Basophils Relative: 1 %
Eosinophils Absolute: 0 10*3/uL (ref 0.0–0.5)
Eosinophils Relative: 1 %
HCT: 42.9 % (ref 39.0–52.0)
Hemoglobin: 14.3 g/dL (ref 13.0–17.0)
Immature Granulocytes: 1 %
Lymphocytes Relative: 21 %
Lymphs Abs: 0.9 10*3/uL (ref 0.7–4.0)
MCH: 29.4 pg (ref 26.0–34.0)
MCHC: 33.3 g/dL (ref 30.0–36.0)
MCV: 88.1 fL (ref 80.0–100.0)
Monocytes Absolute: 0.5 10*3/uL (ref 0.1–1.0)
Monocytes Relative: 11 %
Neutro Abs: 2.8 10*3/uL (ref 1.7–7.7)
Neutrophils Relative %: 65 %
Platelets: 312 10*3/uL (ref 150–400)
RBC: 4.87 MIL/uL (ref 4.22–5.81)
RDW: 14.6 % (ref 11.5–15.5)
WBC: 4.2 10*3/uL (ref 4.0–10.5)
nRBC: 0 % (ref 0.0–0.2)

## 2020-10-17 LAB — MAGNESIUM: Magnesium: 2.2 mg/dL (ref 1.7–2.4)

## 2020-10-17 LAB — ETHANOL: Alcohol, Ethyl (B): 113 mg/dL — ABNORMAL HIGH (ref <10)

## 2020-10-17 MED ORDER — POTASSIUM CHLORIDE ER 10 MEQ PO TBCR: 10.0000 meq | EXTENDED_RELEASE_TABLET | Freq: Every day | ORAL | 0 refills | Status: AC

## 2020-10-17 MED ORDER — LACTATED RINGERS IV BOLUS
1500.0000 mL | Freq: Once | INTRAVENOUS | Status: AC
  Administered 2020-10-17: 1500 mL via INTRAVENOUS

## 2020-10-17 MED ORDER — POTASSIUM CHLORIDE CRYS ER 20 MEQ PO TBCR
60.0000 meq | EXTENDED_RELEASE_TABLET | Freq: Once | ORAL | Status: AC
  Administered 2020-10-17: 60 meq via ORAL
  Filled 2020-10-17: qty 3

## 2020-10-17 NOTE — ED Provider Notes (Signed)
Emergency Medicine Provider Triage Evaluation Note  Gretchen Portela. , a 40 y.o. male  was evaluated in triage.  Pt complains of cramping, lightheadedness, feeling hot.  Review of Systems  Positive: Hands cramping, lightheadedness Negative: Syncope, chest pain, shortness of breath, abdominal pain, vomiting, diarrhea  Physical Exam  BP (!) 163/82 (BP Location: Left Arm)   Pulse (!) 113   Temp 99 F (37.2 C) (Oral)   Resp 18   SpO2 100%  Gen:   Awake, no distress   Resp:  Normal effort  MSK:   Moves extremities without difficulty  Other:    Medical Decision Making  Medically screening exam initiated at 1:25 PM.  Appropriate orders placed.  Gretchen Portela. was informed that the remainder of the evaluation will be completed by another provider, this initial triage assessment does not replace that evaluation, and the importance of remaining in the ED until their evaluation is complete.     Anselm Pancoast, PA-C 10/17/20 1345    Bethann Berkshire, MD 10/19/20 [id number]

## 2020-10-17 NOTE — ED Triage Notes (Addendum)
Per EMS-states he was at work when his fingers started cramping-per EMS-patient states his [employer] went out at work and he thinks he was dehydrated-Pedialyte given

## 2020-10-17 NOTE — Discharge Instructions (Addendum)
Please continue to monitor your symptoms closely.  I have attached information on alcohol cessation as well.  I am prescribing you a potassium supplement you can take for the next 2 weeks.  Please follow-up with your regular doctor after this and have your potassium rechecked.  It is also likely low today because of your alcohol use.  If you develop any new or worsening symptoms please come back to the emergency department.  It was a pleasure to meet you.

## 2020-10-17 NOTE — ED Provider Notes (Signed)
[name]   CSN: [id number] Arrival date & time: 10/17/20  1301     History Chief Complaint  Patient presents with   fingers cramping    Ryan Huynh. is a 40 y.o. male.  HPI Patient is a 40 year old male with a medical history as noted below.  He presents to the emergency department with muscle cramping.  States his symptoms started this morning while at work.  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  He does note that he had an uncle who recently died and was drinking heavily yesterday due to this.  He continues to drink some this morning as well.  Denies currently being intoxicated.  States that he feels "dehydrated".  No headache, dizziness, visual changes, chest pain, shortness of breath, nausea, vomiting, diarrhea, abdominal pain.    Past Medical History:  Diagnosis Date   No pertinent past medical history     There are no problems to display for this patient.   Past Surgical History:  Procedure Laterality Date   NO PAST SURGERIES     ORIF ANKLE FRACTURE Right 10/28/2018   Procedure: OPEN REDUCTION INTERNAL FIXATION (ORIF) ANKLE FRACTURE RIGHT;  Surgeon: Sheral Apley, MD;  Location: [facility] OR;  Service: Orthopedics;  Laterality: Right;       No family history on file.  Social History   Tobacco Use   Smoking status: Never   Smokeless tobacco: Never  Vaping Use   Vaping Use: Never used  Substance Use Topics   Alcohol use: Yes    Alcohol/week: 2.0 standard drinks    Types: 2 Cans of beer per week    Comment: Daily   Drug use: Not Currently    Home Medications Prior to Admission medications   Medication Sig Start Date End Date Taking? Authorizing Provider  cholecalciferol (VITAMIN D3) 25 MCG (1000 UNIT) tablet Take 1,000 Units by mouth daily.   Yes [provider]  Multiple Vitamin (MULTIVITAMIN WITH MINERALS) TABS tablet Take 1 tablet by mouth  daily.   Yes [provider]  Tetrahydroz-Dextran-PEG-Povid (VISINE ADVANCED RELIEF OP) Place 1 drop into both eyes 2 (two) times daily.   Yes [provider]  aspirin EC 81 MG tablet Take 1 tablet (81 mg total) by mouth 2 (two) times daily. For DVT prophylaxis for 30 days after surgery. Patient not taking: No sig reported 10/28/18   Albina Billet III, PA-C  gabapentin (NEURONTIN) 300 MG capsule Take 1 capsule (300 mg total) by mouth 2 (two) times daily as needed for up to 14 days (Pain). For 2 weeks post op for pain. Patient not taking: Reported on 10/17/2020 10/28/18 11/11/18  Albina Billet III, PA-C  methocarbamol (ROBAXIN) 500 MG tablet Take 1 tablet (500 mg total) by mouth every 12 (twelve) hours as needed for muscle spasms. Patient not taking: No sig reported 10/28/18   Albina Billet III, PA-C  ondansetron (ZOFRAN) 4 MG tablet Take 1 tablet (4 mg total) by mouth every 8 (eight) hours as needed for nausea or vomiting. Patient not taking: No sig reported 10/28/18   Albina Billet III, PA-C    Allergies    Patient has no known allergies.  Review of Systems   Review of Systems  All other systems reviewed and are negative. Ten systems reviewed and are negative for acute change, except as noted in the HPI.  Physical Exam Updated Vital Signs BP (!) 172/112   Pulse 94   Temp 99 F (37.2 C) (Oral)   Resp 18   SpO2 100%   Physical Exam Vitals and nursing note reviewed.  Constitutional:      General: He is not in acute distress.    Appearance: Normal appearance. He is not ill-appearing, toxic-appearing or diaphoretic.  HENT:     Head: Normocephalic and atraumatic.     Right Ear: External ear normal.     Left Ear: External ear normal.     Nose: Nose normal.     Mouth/Throat:     Mouth: Mucous membranes are dry.     Pharynx: Oropharynx is clear. No oropharyngeal exudate or posterior oropharyngeal erythema.  Eyes:     General: No  scleral icterus.       Right eye: No discharge.        Left eye: No discharge.     Extraocular Movements: Extraocular movements intact.     Conjunctiva/sclera: Conjunctivae normal.     Pupils: Pupils are equal, round, and reactive to light.  Cardiovascular:     Rate and Rhythm: Normal rate and regular rhythm.     Pulses: Normal pulses.     Heart sounds: Normal heart sounds. No murmur heard.   No friction rub. No gallop.  Pulmonary:     Effort: Pulmonary effort is normal. No respiratory distress.     Breath sounds: Normal breath sounds. No stridor. No wheezing, rhonchi or rales.  Abdominal:     General: Abdomen is flat.     Palpations: Abdomen is soft.     Tenderness: There is no abdominal tenderness.     Comments: Protuberant abdomen that is soft and nontender.  Musculoskeletal:        General: Normal range of motion.     Cervical back: Normal range of motion and neck supple. No tenderness.  Skin:    General: Skin is warm and dry.  Neurological:     General: No focal deficit present.     Mental Status: He is alert and oriented to person, place, and time.     Comments: Speaking clearly, coherently and in complete sentences.  Moving all 4 extremities with ease.  No gross deficits.  Psychiatric:        Mood and Affect: Mood normal.        Behavior: Behavior normal.    ED Results / Procedures / Treatments   Labs (all labs ordered are listed, but only abnormal results are displayed) Labs Reviewed  COMPREHENSIVE METABOLIC PANEL - Abnormal; Notable for the following components:      Result Value   Potassium 3.0 (*)    CO2 21 (*)    Glucose, Bld 127 (*)    Total Protein 9.1 (*)    AST 43 (*)    Anion gap 17 (*)    All other components within normal limits  ETHANOL - Abnormal; Notable for the following components:   Alcohol, Ethyl (B) 113 (*)    All other components within normal limits  CBC WITH DIFFERENTIAL/PLATELET  MAGNESIUM  URINALYSIS, ROUTINE W REFLEX MICROSCOPIC    EKG None  Radiology No results found.  Procedures Procedures   Medications Ordered in ED Medications  lactated ringers bolus 1,500 mL (1,500 mLs Intravenous New Bag/Given (Non-Interop) 10/17/20 1945)  potassium chloride SA (KLOR-CON) CR tablet 60 mEq (60 mEq Oral Given 10/17/20 1946)    ED Course  I have reviewed the triage  vital signs and the nursing notes.  Pertinent labs & imaging results that were available during my care of the patient were reviewed by me and considered in my medical decision making (see chart for details).    MDM Rules/Calculators/A&P                          Pt is a 40 y.o. male who presents to the emergency department with muscle cramping.  Labs: CBC without abnormalities. CMP with a potassium of 3, CO2 of 21, glucose of 127, total protein of 9.1, AST of 43, anion gap of 17. Ethanol of 113. Magnesium of 2.2. UA with 20 ketones.  I, Placido Sou, PA-C, personally reviewed and evaluated these images and lab results as part of my medical decision-making.  Lab work is consistent with alcoholic ketoacidosis.  Very mild anion gap of 17.  Also hypokalemic at 3.  Patient repleted with Klor-Con as well as 1.5 L of lactated Ringer's.  He states that his symptoms have resolved and he is feeling much better.  Patient has no complaints at this time.   Feel the patient is stable for discharge at this time.  Discussed alcohol cessation in length.  Given information on alcohol cessation.  Discussed return precautions.  His questions were answered and he was amicable at the time of discharge.  Note: Portions of this report may have been transcribed using voice recognition software. Every effort was made to ensure accuracy; however, inadvertent computerized transcription errors may be present.   Final Clinical Impression(s) / ED Diagnoses Final diagnoses:  Alcoholic ketoacidosis  Hypokalemia   Rx / DC Orders ED Discharge Orders     None         Placido Sou, PA-C 10/17/20 2135    Cathren Laine, MD 10/18/20 1649

## 2021-10-25 ENCOUNTER — Encounter (HOSPITAL_COMMUNITY): Payer: Self-pay

## 2021-10-25 ENCOUNTER — Emergency Department (HOSPITAL_COMMUNITY)
Admission: EM | Admit: 2021-10-25 | Discharge: 2021-10-25 | Disposition: A | Discharge status: Home / Self Care | Payer: Managed Care, Other (non HMO) | Attending: Emergency Medicine | Admitting: Emergency Medicine

## 2021-10-25 DIAGNOSIS — E860 Dehydration: Secondary | ICD-10-CM | POA: Insufficient documentation

## 2021-10-25 DIAGNOSIS — Z7982 Long term (current) use of aspirin: Secondary | ICD-10-CM | POA: Insufficient documentation

## 2021-10-25 HISTORY — DX: Anxiety disorder, unspecified: F41.9

## 2021-10-25 HISTORY — DX: Depression, unspecified: F32.A

## 2021-10-25 LAB — COMPREHENSIVE METABOLIC PANEL
ALT: 48 U/L — ABNORMAL HIGH (ref 0–44)
AST: 40 U/L (ref 15–41)
Albumin: 3.9 g/dL (ref 3.5–5.0)
Alkaline Phosphatase: 43 U/L (ref 38–126)
Anion gap: 10 (ref 5–15)
BUN: 10 mg/dL (ref 6–20)
CO2: 21 mmol/L — ABNORMAL LOW (ref 22–32)
Calcium: 8.4 mg/dL — ABNORMAL LOW (ref 8.9–10.3)
Chloride: 111 mmol/L (ref 98–111)
Creatinine, Ser: 0.63 mg/dL (ref 0.61–1.24)
GFR, Estimated: >60 mL/min (ref >60)
Glucose, Bld: 92 mg/dL (ref 70–99)
Potassium: 3.5 mmol/L (ref 3.5–5.1)
Sodium: 142 mmol/L (ref 135–145)
Total Bilirubin: 0.7 mg/dL (ref 0.3–1.2)
Total Protein: 7.5 g/dL (ref 6.5–8.1)

## 2021-10-25 LAB — CBC WITH DIFFERENTIAL/PLATELET
Abs Immature Granulocytes: 0 10*3/uL (ref 0.00–0.07)
Basophils Absolute: 0 10*3/uL (ref 0.0–0.1)
Basophils Relative: 1 %
Eosinophils Absolute: 0 10*3/uL (ref 0.0–0.5)
Eosinophils Relative: 0 %
HCT: 41.9 % (ref 39.0–52.0)
Hemoglobin: 14 g/dL (ref 13.0–17.0)
Immature Granulocytes: 0 %
Lymphocytes Relative: 31 %
Lymphs Abs: 1 10*3/uL (ref 0.7–4.0)
MCH: 29.4 pg (ref 26.0–34.0)
MCHC: 33.4 g/dL (ref 30.0–36.0)
MCV: 88 fL (ref 80.0–100.0)
Monocytes Absolute: 0.3 10*3/uL (ref 0.1–1.0)
Monocytes Relative: 11 %
Neutro Abs: 1.8 10*3/uL (ref 1.7–7.7)
Neutrophils Relative %: 57 %
Platelets: 327 10*3/uL (ref 150–400)
RBC: 4.76 MIL/uL (ref 4.22–5.81)
RDW: 14.1 % (ref 11.5–15.5)
WBC: 3.1 10*3/uL — ABNORMAL LOW (ref 4.0–10.5)
nRBC: 0 % (ref 0.0–0.2)

## 2021-10-25 LAB — MAGNESIUM: Magnesium: 1.8 mg/dL (ref 1.7–2.4)

## 2021-10-25 LAB — ETHANOL: Alcohol, Ethyl (B): 97 mg/dL — ABNORMAL HIGH (ref <10)

## 2021-10-25 MED ORDER — SODIUM CHLORIDE 0.9 % IV BOLUS
1000.0000 mL | Freq: Once | INTRAVENOUS | Status: AC
  Administered 2021-10-25: 1000 mL via INTRAVENOUS

## 2021-10-25 NOTE — ED Notes (Signed)
Dr. Jeraldine Loots at bedside assessing patient at this time.
